# Patient Record
Sex: Male | Born: 1995 | Race: Black or African American | Hispanic: No | Marital: Single | Smoking: Current every day smoker
Health system: Southern US, Community
[De-identification: ages and names within clinical notes are randomized; demographics above are authoritative.]

## PROBLEM LIST (undated history)

## (undated) HISTORY — PX: APPENDECTOMY: SHX54

## (undated) HISTORY — PX: HERNIA REPAIR: SHX51

---
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
## 2015-02-08 ENCOUNTER — Encounter (HOSPITAL_COMMUNITY): Payer: Self-pay | Admitting: Emergency Medicine

## 2015-02-08 ENCOUNTER — Emergency Department (HOSPITAL_COMMUNITY)
Admission: EM | Admit: 2015-02-08 | Discharge: 2015-02-08 | Disposition: A | Discharge status: Home / Self Care | Payer: BC Managed Care – PPO | Attending: Emergency Medicine | Admitting: Emergency Medicine

## 2015-02-08 DIAGNOSIS — Z720 Tobacco use: Secondary | ICD-10-CM | POA: Insufficient documentation

## 2015-02-08 DIAGNOSIS — J039 Acute tonsillitis, unspecified: Secondary | ICD-10-CM | POA: Insufficient documentation

## 2015-02-08 DIAGNOSIS — J029 Acute pharyngitis, unspecified: Secondary | ICD-10-CM | POA: Diagnosis present

## 2015-02-08 LAB — RAPID STREP SCREEN (MED CTR MEBANE ONLY): STREPTOCOCCUS, GROUP A SCREEN (DIRECT): NEGATIVE

## 2015-02-08 MED ORDER — HYDROCODONE-ACETAMINOPHEN 7.5-325 MG/15ML PO SOLN
10.0000 mL | Freq: Once | ORAL | Status: AC
Start: 2015-02-08 — End: 2015-02-08
  Administered 2015-02-08: 10 mL via ORAL
  Filled 2015-02-08: qty 15

## 2015-02-08 MED ORDER — MAGIC MOUTHWASH W/LIDOCAINE: 5.0000 mL | Freq: Three times a day (TID) | ORAL | Status: AC | PRN

## 2015-02-08 MED ORDER — HYDROCODONE-ACETAMINOPHEN 5-325 MG PO TABS: ORAL_TABLET | ORAL | Status: AC

## 2015-02-08 MED ORDER — PREDNISONE 50 MG PO TABS: 50.0000 mg | ORAL_TABLET | Freq: Every day | ORAL | Status: AC

## 2015-02-08 MED ORDER — CLINDAMYCIN HCL 150 MG PO CAPS
300.0000 mg | ORAL_CAPSULE | Freq: Once | ORAL | Status: AC
  Administered 2015-02-08: 300 mg via ORAL
  Filled 2015-02-08: qty 2

## 2015-02-08 MED ORDER — CLINDAMYCIN HCL 150 MG PO CAPS: 300.0000 mg | ORAL_CAPSULE | Freq: Four times a day (QID) | ORAL | Status: AC

## 2015-02-08 NOTE — ED Notes (Signed)
Patient complaining of sore throat x 1 week.  

## 2015-02-08 NOTE — Discharge Instructions (Signed)
Tonsillitis °Tonsillitis is an infection of the throat. This infection causes the tonsils to become red, tender, and puffy (swollen). Tonsils are groups of tissue at the back of your throat. If bacteria caused your infection, antibiotic medicine will be given to you. Sometimes symptoms of tonsillitis can be relieved with the use of steroid medicine. If your tonsillitis is severe and happens often, you may need to get your tonsils removed (tonsillectomy). °HOME CARE  °· Rest and sleep often. °· Drink enough fluids to keep your pee (urine) clear or pale yellow. °· While your throat is sore, eat soft or liquid foods like: °¨ Soup. °¨ Ice cream. °¨ Instant breakfast drinks. °· Eat frozen ice pops. °· Gargle with a warm or cold liquid to help soothe the throat. Gargle with a water and salt mix. Mix 1/4 teaspoon of salt and 1/4 teaspoon of baking soda in 1 cup of water. °· Only take medicines as told by your doctor. °· If you are given medicines (antibiotics), take them as told. Finish them even if you start to feel better. °GET HELP IF: °· You have large, tender lumps in your neck. °· You have a rash. °· You cough up green, yellow-brown, or bloody fluid. °· You cannot swallow liquids or food for 24 hours. °· You notice that only one of your tonsils is swollen. °GET HELP RIGHT AWAY IF:  °· You throw up (vomit). °· You have a very bad headache. °· You have a stiff neck. °· You have chest pain. °· You have trouble breathing or swallowing. °· You have bad throat pain, drooling, or your voice changes. °· You have bad pain not helped by medicine. °· You cannot fully open your mouth. °· You have redness, puffiness, or bad pain in the neck. °· You have a fever. °MAKE SURE YOU:  °· Understand these instructions. °· Will watch your condition. °· Will get help right away if you are not doing well or get worse. °Document Released: 01/09/2008 Document Revised: 07/28/2013 Document Reviewed: 01/09/2013 °[employer]® Patient Information  ©2015 [employer]. This information is not intended to replace advice given to you by your health care provider. Make sure you discuss any questions you have with your health care provider. ° °

## 2015-02-09 NOTE — ED Provider Notes (Signed)
CSN: [id number]     Arrival date & time 02/08/15  1427 History   First MD Initiated Contact with Patient 02/08/15 1515     Chief Complaint  Patient presents with  . Sore Throat     (Consider location/radiation/quality/duration/timing/severity/associated sxs/prior Treatment) HPI  George EHLE is a 19 y.o. male who presents to the Emergency Department complaining of sore throat for one week.  He reports pain with swallowing.  He has not tried any medications for his symptoms.  He has been drinking fluids, but decreased food intake.  He denies fever, chills, abdominal pain, fatigue, myalgias, rash, and vomiting.     History reviewed. No pertinent past medical history. Past Surgical History  Procedure Laterality Date  . Hernia repair    . Appendectomy     History reviewed. No pertinent family history. History  Substance Use Topics  . Smoking status: Current Every Day Smoker  . Smokeless tobacco: Not on file  . Alcohol Use: No    Review of Systems  Constitutional: Negative for fever, chills, activity change and appetite change.  HENT: Positive for sore throat. Negative for congestion, ear pain, facial swelling, trouble swallowing and voice change.   Eyes: Negative for pain and visual disturbance.  Respiratory: Negative for cough and shortness of breath.   Gastrointestinal: Negative for nausea, vomiting and abdominal pain.  Musculoskeletal: Negative for arthralgias, neck pain and neck stiffness.  Skin: Negative for color change and rash.  Neurological: Negative for dizziness, facial asymmetry, speech difficulty, numbness and headaches.  Hematological: Negative for adenopathy.  All other systems reviewed and are negative.     Allergies  Review of patient's allergies indicates no known allergies.  Home Medications   Prior to Admission medications   Medication Sig Start Date End Date Taking? Authorizing Provider  Alum & Mag Hydroxide-Simeth (MAGIC MOUTHWASH W/LIDOCAINE) SOLN  Take 5 mLs by mouth 3 (three) times daily as needed for mouth pain. Swish and spit, do not swallow 02/08/15   Lakisa Lotz, PA-C  clindamycin (CLEOCIN) 150 MG capsule Take 2 capsules (300 mg total) by mouth 4 (four) times daily. For 7 days 02/08/15   George Aus, PA-C  HYDROcodone-acetaminophen (NORCO/VICODIN) 5-325 MG per tablet Take one-two tabs po q 4-6 hrs prn pain 02/08/15   Jerre Diguglielmo, PA-C  predniSONE (DELTASONE) 50 MG tablet Take 1 tablet (50 mg total) by mouth daily. For 4 days 02/08/15   Iridian Reader, PA-C   BP 126/106 mmHg  Pulse 100  Temp(Src) 99.8 F (37.7 C) (Oral)  Resp 16  Ht  (1.854 m)  Wt 240 lb (108.863 kg)  BMI 31.67 kg/m2  SpO2 100% Physical Exam  Constitutional: He is oriented to person, place, and time. He appears well-developed and well-nourished. No distress.  HENT:  Head: Normocephalic and atraumatic.  Right Ear: Tympanic membrane and ear canal normal.  Left Ear: Tympanic membrane and ear canal normal.  Mouth/Throat: Uvula is midline and mucous membranes are normal. No trismus in the jaw. No uvula swelling. Oropharyngeal exudate and posterior oropharyngeal erythema present. No posterior oropharyngeal edema or tonsillar abscesses.  Large exudates to the bilateral tonsils.  Mild to moderate erythema.  Uvula is midline.  No peritonsillar abscess.    Neck: Normal range of motion. Neck supple.  Cardiovascular: Normal rate, regular rhythm and normal heart sounds.   No murmur heard. Pulmonary/Chest: Effort normal and breath sounds normal. No respiratory distress.  Abdominal: Soft. Normal appearance. There is no splenomegaly. There is no tenderness. There  is no rebound and no guarding.  Musculoskeletal: Normal range of motion.  Lymphadenopathy:    He has no cervical adenopathy.  Neurological: He is alert and oriented to person, place, and time. He exhibits normal muscle tone. Coordination normal.  Skin: Skin is warm and dry. No rash noted.  Nursing note and  vitals reviewed.   ED Course  Procedures (including critical care time) Labs Review Labs Reviewed  [facility] ST[facility] SCREEN ([facility])  CULTURE, GROUP A STREP    Imaging Review No results found.   EKG Interpretation None      MDM   Final diagnoses:  Tonsillitis    Pt is non-toxic appearing.  Airway patent.  No PTA.  Pt has extensive exudative tonsillitis.  He agrees to increase fluids, ibuprofen for fever.  rx for prednisone, clindamycin and magic mouthwash.  Given referral for ENT.  Pt also agrees to return here for any worsening sx's    George Ausammy Karagan Lehr, PA-C 02/09/15 2243  Donnetta HutchingBrian Cook, MD 02/11/15 [phone]

## 2015-02-12 LAB — CULTURE, GROUP A STREP: Strep A Culture: NEGATIVE

## 2018-10-17 ENCOUNTER — Ambulatory Visit: Payer: BC Managed Care – PPO | Admitting: Family Medicine
